# Patient Record
Sex: Male | Born: 1950
Health system: Southern US, Community
[De-identification: ages and names within clinical notes are randomized; demographics above are authoritative.]

## PROBLEM LIST (undated history)

## (undated) ENCOUNTER — Emergency Department (HOSPITAL_BASED_OUTPATIENT_CLINIC_OR_DEPARTMENT_OTHER): Payer: Medicare HMO

## (undated) DIAGNOSIS — I10 Essential (primary) hypertension: Secondary | ICD-10-CM

## (undated) DIAGNOSIS — F419 Anxiety disorder, unspecified: Secondary | ICD-10-CM

## (undated) HISTORY — PX: FOOT SURGERY: SHX648

---

## 1999-02-26 ENCOUNTER — Other Ambulatory Visit: Admission: RE | Admit: 1999-02-26 | Discharge: 1999-02-26 | Payer: Self-pay | Admitting: Podiatry

## 1999-10-08 ENCOUNTER — Encounter: Payer: Self-pay | Admitting: Gastroenterology

## 1999-10-08 ENCOUNTER — Ambulatory Visit (HOSPITAL_COMMUNITY): Admission: RE | Admit: 1999-10-08 | Discharge: 1999-10-08 | Payer: Self-pay | Admitting: Gastroenterology

## 2001-09-14 ENCOUNTER — Encounter: Payer: Self-pay | Admitting: Family Medicine

## 2001-09-14 ENCOUNTER — Encounter: Admission: RE | Admit: 2001-09-14 | Discharge: 2001-09-14 | Payer: Self-pay | Admitting: Family Medicine

## 2007-02-10 ENCOUNTER — Encounter: Admission: RE | Admit: 2007-02-10 | Discharge: 2007-02-10 | Payer: Self-pay | Admitting: Family Medicine

## 2008-01-03 ENCOUNTER — Encounter: Admission: RE | Admit: 2008-01-03 | Discharge: 2008-01-03 | Payer: Self-pay | Admitting: Family Medicine

## 2008-01-05 ENCOUNTER — Encounter: Admission: RE | Admit: 2008-01-05 | Discharge: 2008-01-05 | Payer: Self-pay | Admitting: Family Medicine

## 2008-12-06 IMAGING — CR DG THORACIC SPINE 3V
3 series · 3 of 3 positions shown · non-contrast
Comparison: 01/03/2008

CLINICAL DATA: Opacity seen over thoracic spine on prior chest x-
ray.

THORACIC SPINE - 2 VIEW + SWIMMERS

[t t-spine a.p.]
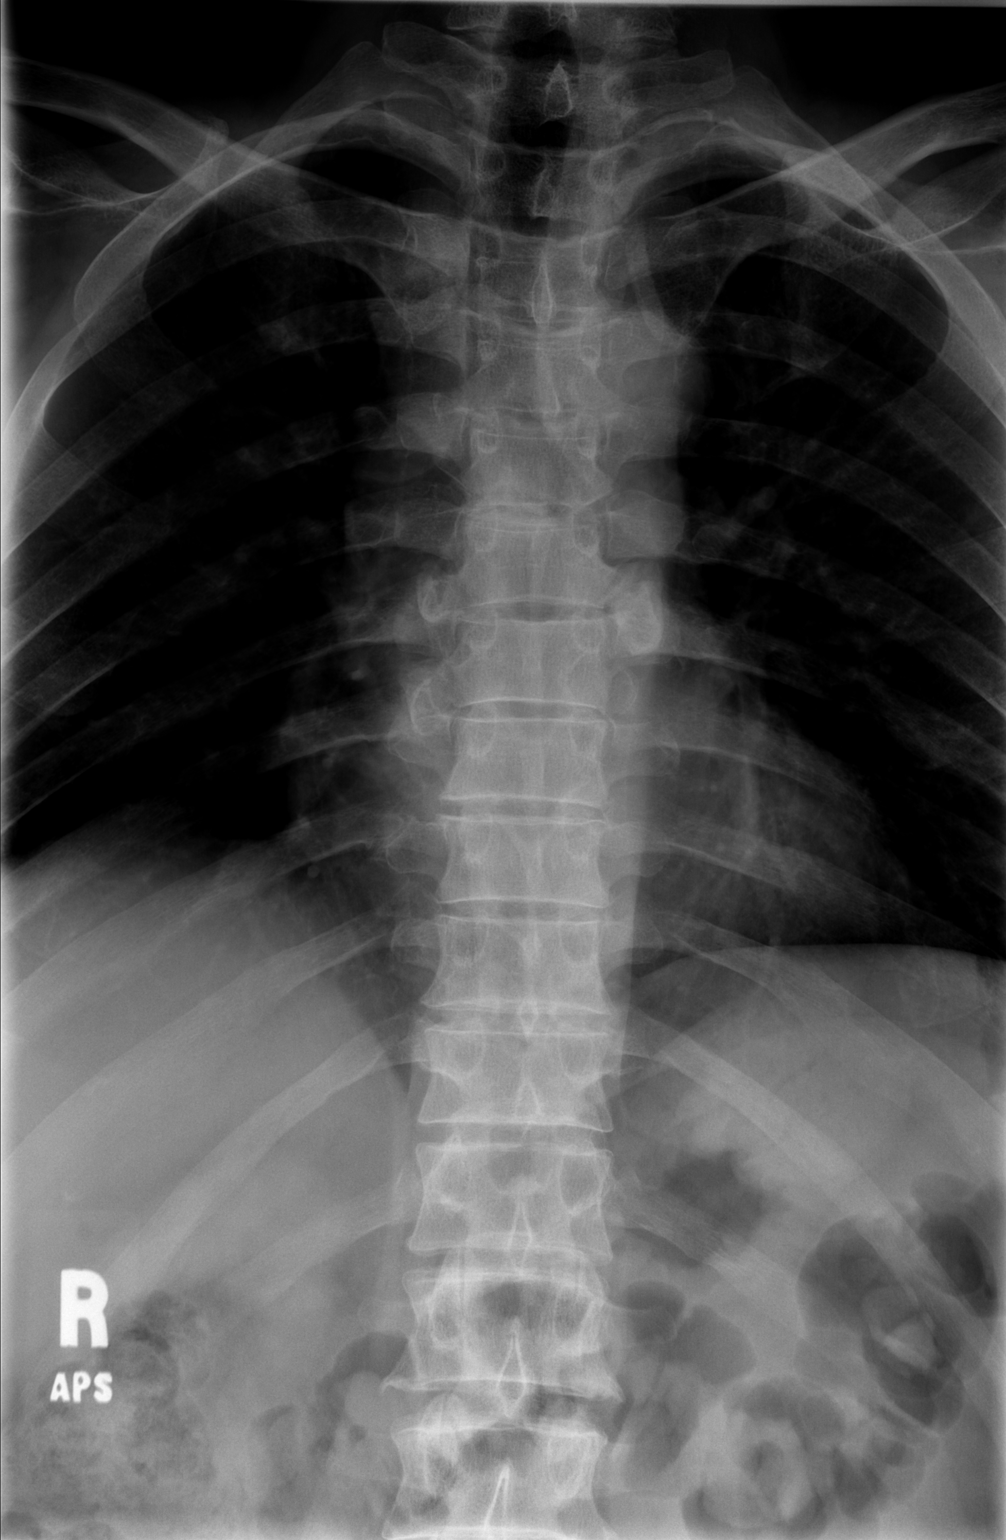

[t t-spine lat *]
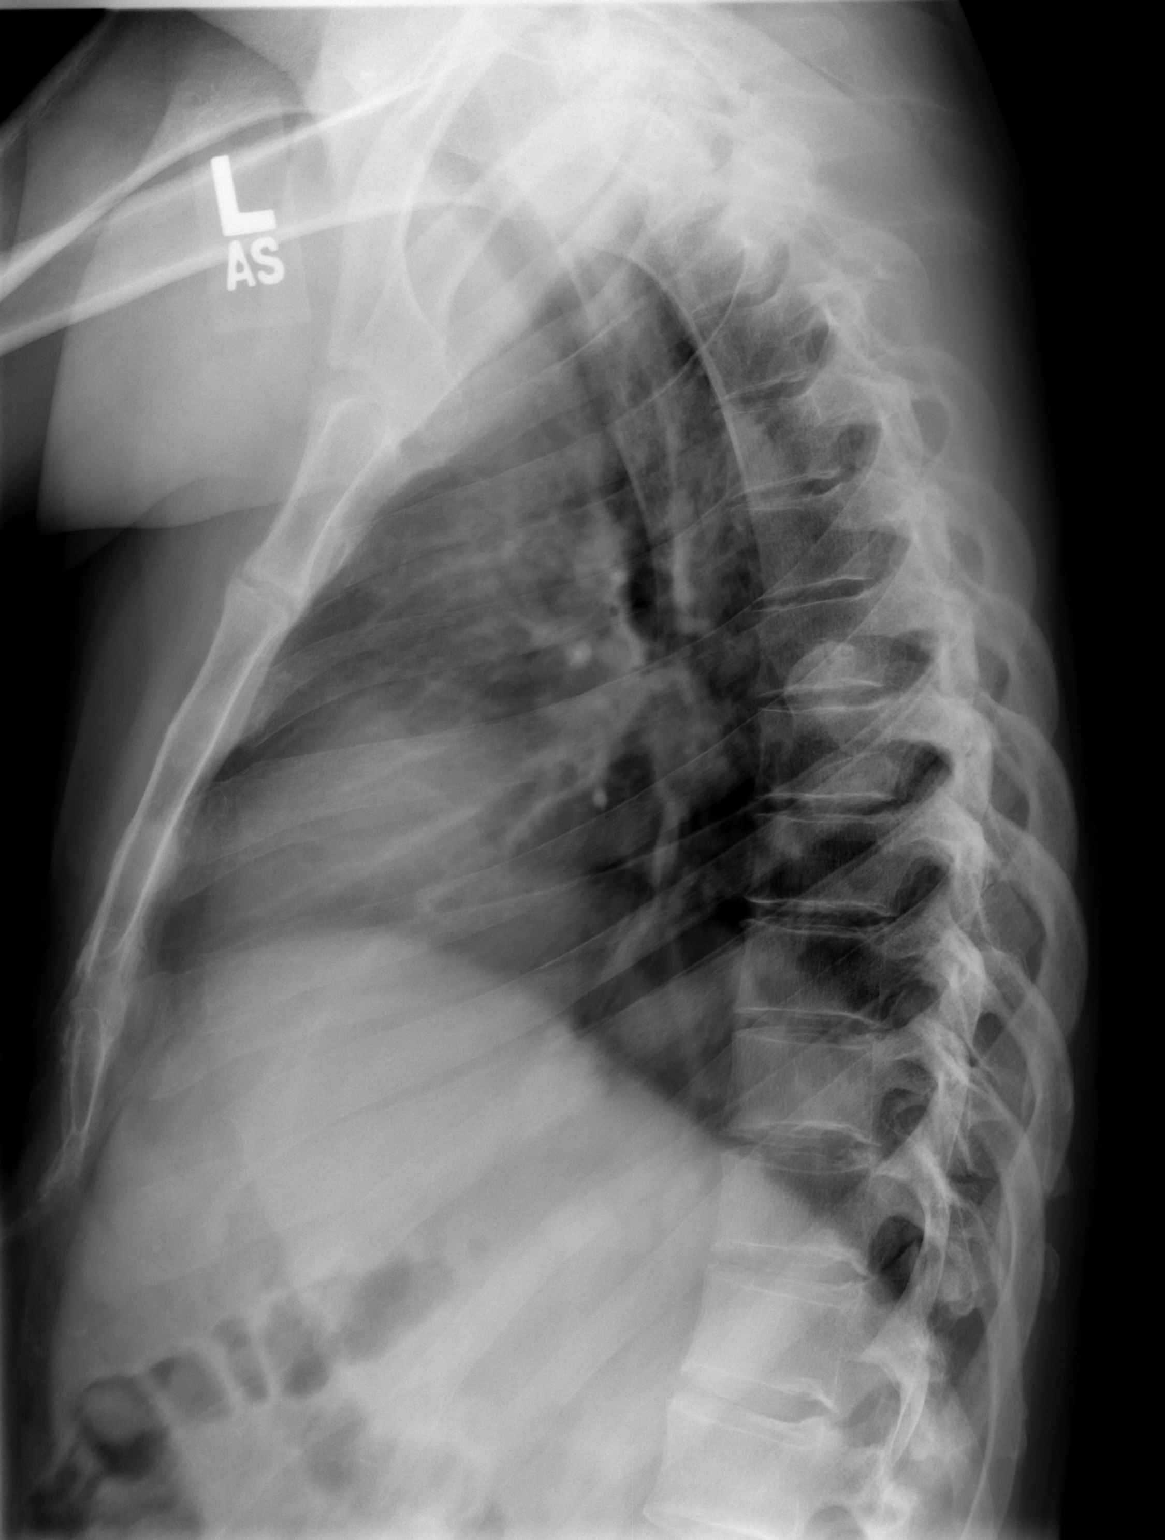

[t swimmers]
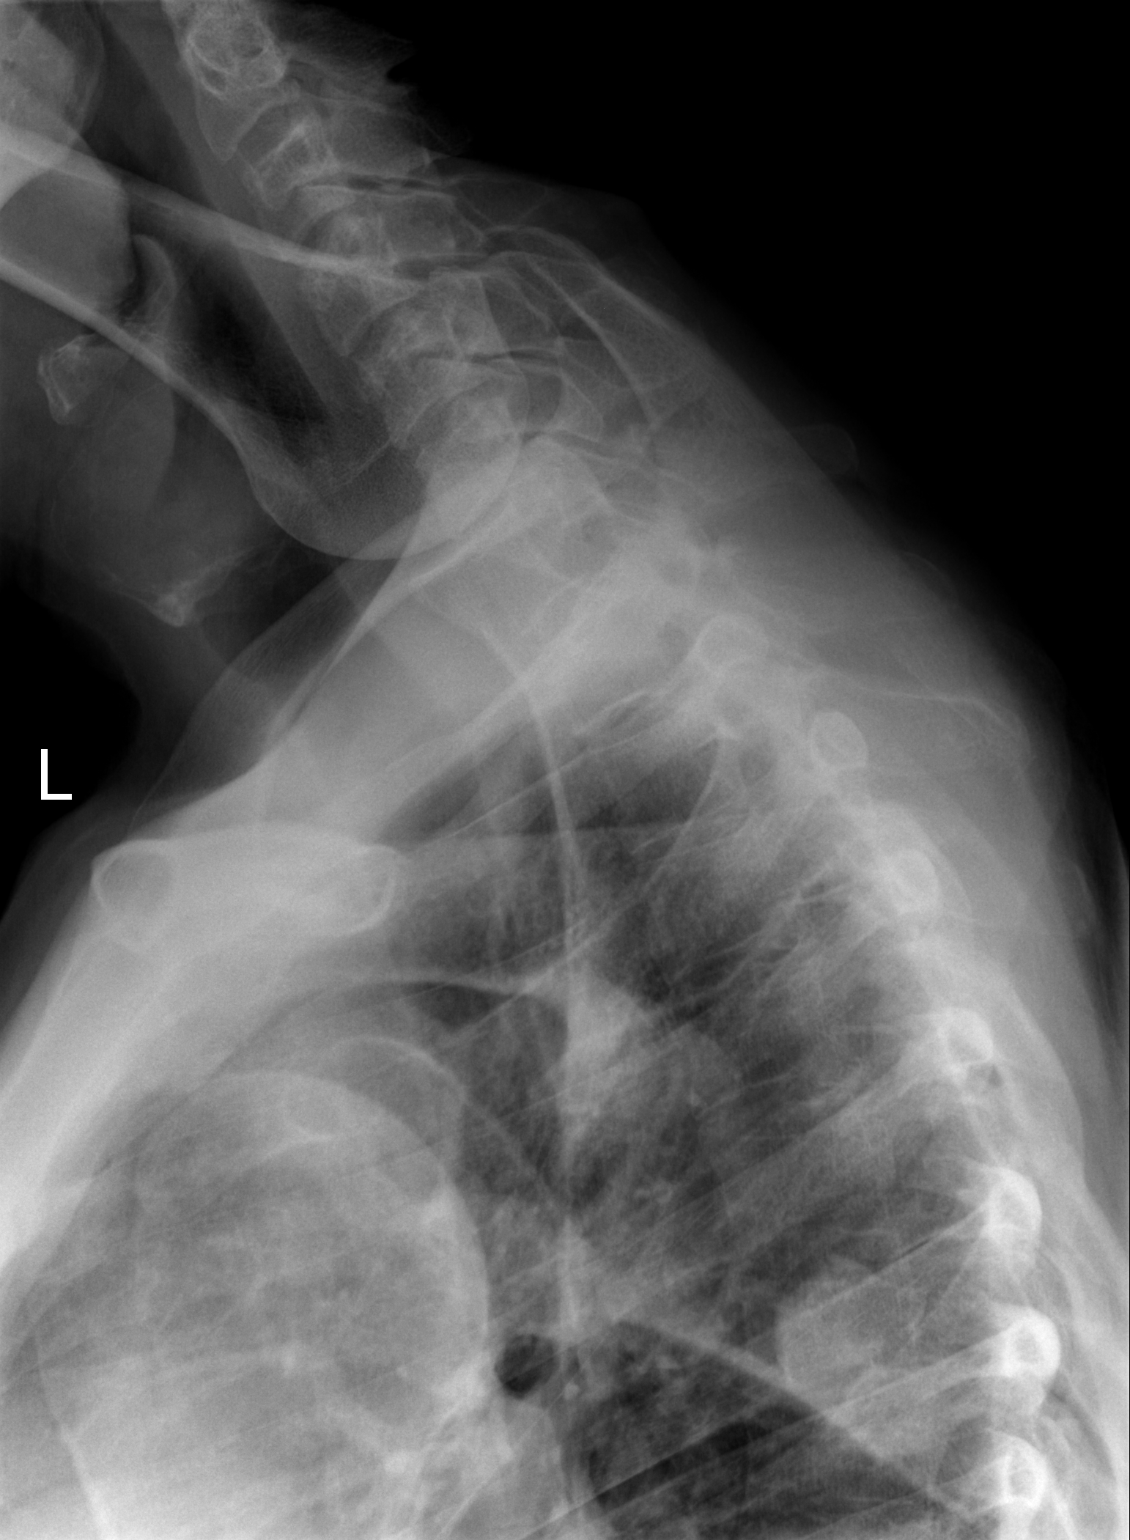

[3 of 3 positions shown; findings below may reference images not displayed]

FINDINGS: Again noted is the density projecting over the T6-7 level
on the lateral view.  On the frontal view, this appears to be
related to hypertrophic spur on the left side at the T6-7 level.
There is normal alignment.  No acute bony abnormality.
IMPRESSION: Density seen on chest x-ray appears to represent a spur on the left
side at the T6-7 level.

## 2012-06-25 DIAGNOSIS — R079 Chest pain, unspecified: Secondary | ICD-10-CM | POA: Insufficient documentation

## 2016-01-24 DIAGNOSIS — Z23 Encounter for immunization: Secondary | ICD-10-CM | POA: Diagnosis not present

## 2016-01-24 DIAGNOSIS — Z125 Encounter for screening for malignant neoplasm of prostate: Secondary | ICD-10-CM | POA: Diagnosis not present

## 2016-01-24 DIAGNOSIS — E7800 Pure hypercholesterolemia, unspecified: Secondary | ICD-10-CM | POA: Diagnosis not present

## 2016-01-24 DIAGNOSIS — R7303 Prediabetes: Secondary | ICD-10-CM | POA: Diagnosis not present

## 2016-01-24 DIAGNOSIS — M19071 Primary osteoarthritis, right ankle and foot: Secondary | ICD-10-CM | POA: Diagnosis not present

## 2016-01-24 DIAGNOSIS — I10 Essential (primary) hypertension: Secondary | ICD-10-CM | POA: Diagnosis not present

## 2016-01-24 DIAGNOSIS — K219 Gastro-esophageal reflux disease without esophagitis: Secondary | ICD-10-CM | POA: Diagnosis not present

## 2016-01-24 DIAGNOSIS — R7309 Other abnormal glucose: Secondary | ICD-10-CM | POA: Diagnosis not present

## 2016-01-24 DIAGNOSIS — Z0000 Encounter for general adult medical examination without abnormal findings: Secondary | ICD-10-CM | POA: Diagnosis not present

## 2016-03-18 DIAGNOSIS — H40013 Open angle with borderline findings, low risk, bilateral: Secondary | ICD-10-CM | POA: Diagnosis not present

## 2016-08-12 DIAGNOSIS — I10 Essential (primary) hypertension: Secondary | ICD-10-CM | POA: Diagnosis not present

## 2016-08-12 DIAGNOSIS — E7800 Pure hypercholesterolemia, unspecified: Secondary | ICD-10-CM | POA: Diagnosis not present

## 2016-08-12 DIAGNOSIS — K219 Gastro-esophageal reflux disease without esophagitis: Secondary | ICD-10-CM | POA: Diagnosis not present

## 2016-08-12 DIAGNOSIS — M19071 Primary osteoarthritis, right ankle and foot: Secondary | ICD-10-CM | POA: Diagnosis not present

## 2017-02-12 DIAGNOSIS — Z0000 Encounter for general adult medical examination without abnormal findings: Secondary | ICD-10-CM | POA: Diagnosis not present

## 2017-02-12 DIAGNOSIS — Z125 Encounter for screening for malignant neoplasm of prostate: Secondary | ICD-10-CM | POA: Diagnosis not present

## 2017-02-12 DIAGNOSIS — R7303 Prediabetes: Secondary | ICD-10-CM | POA: Diagnosis not present

## 2017-02-12 DIAGNOSIS — M19071 Primary osteoarthritis, right ankle and foot: Secondary | ICD-10-CM | POA: Diagnosis not present

## 2017-02-12 DIAGNOSIS — I10 Essential (primary) hypertension: Secondary | ICD-10-CM | POA: Diagnosis not present

## 2017-02-12 DIAGNOSIS — Z1159 Encounter for screening for other viral diseases: Secondary | ICD-10-CM | POA: Diagnosis not present

## 2017-02-12 DIAGNOSIS — E7800 Pure hypercholesterolemia, unspecified: Secondary | ICD-10-CM | POA: Diagnosis not present

## 2017-02-12 DIAGNOSIS — K219 Gastro-esophageal reflux disease without esophagitis: Secondary | ICD-10-CM | POA: Diagnosis not present

## 2017-02-12 DIAGNOSIS — Z23 Encounter for immunization: Secondary | ICD-10-CM | POA: Diagnosis not present

## 2017-03-16 DIAGNOSIS — H40013 Open angle with borderline findings, low risk, bilateral: Secondary | ICD-10-CM | POA: Diagnosis not present

## 2017-08-17 DIAGNOSIS — K219 Gastro-esophageal reflux disease without esophagitis: Secondary | ICD-10-CM | POA: Diagnosis not present

## 2017-08-17 DIAGNOSIS — E7800 Pure hypercholesterolemia, unspecified: Secondary | ICD-10-CM | POA: Diagnosis not present

## 2017-08-17 DIAGNOSIS — I10 Essential (primary) hypertension: Secondary | ICD-10-CM | POA: Diagnosis not present

## 2017-08-17 DIAGNOSIS — M19071 Primary osteoarthritis, right ankle and foot: Secondary | ICD-10-CM | POA: Diagnosis not present

## 2017-10-04 DIAGNOSIS — T34832A Frostbite with tissue necrosis of left toe(s), initial encounter: Secondary | ICD-10-CM | POA: Diagnosis not present

## 2018-03-14 DIAGNOSIS — H2513 Age-related nuclear cataract, bilateral: Secondary | ICD-10-CM | POA: Diagnosis not present

## 2018-03-18 DIAGNOSIS — M720 Palmar fascial fibromatosis [Dupuytren]: Secondary | ICD-10-CM | POA: Diagnosis not present

## 2018-03-18 DIAGNOSIS — L720 Epidermal cyst: Secondary | ICD-10-CM | POA: Diagnosis not present

## 2018-03-18 DIAGNOSIS — K219 Gastro-esophageal reflux disease without esophagitis: Secondary | ICD-10-CM | POA: Diagnosis not present

## 2018-03-18 DIAGNOSIS — Z125 Encounter for screening for malignant neoplasm of prostate: Secondary | ICD-10-CM | POA: Diagnosis not present

## 2018-03-18 DIAGNOSIS — E7800 Pure hypercholesterolemia, unspecified: Secondary | ICD-10-CM | POA: Diagnosis not present

## 2018-03-18 DIAGNOSIS — R7303 Prediabetes: Secondary | ICD-10-CM | POA: Diagnosis not present

## 2018-03-18 DIAGNOSIS — M19071 Primary osteoarthritis, right ankle and foot: Secondary | ICD-10-CM | POA: Diagnosis not present

## 2018-03-18 DIAGNOSIS — Z0000 Encounter for general adult medical examination without abnormal findings: Secondary | ICD-10-CM | POA: Diagnosis not present

## 2018-03-18 DIAGNOSIS — I10 Essential (primary) hypertension: Secondary | ICD-10-CM | POA: Diagnosis not present

## 2018-03-25 ENCOUNTER — Other Ambulatory Visit: Payer: Self-pay | Admitting: Orthopedic Surgery

## 2018-03-25 DIAGNOSIS — R229 Localized swelling, mass and lump, unspecified: Secondary | ICD-10-CM | POA: Diagnosis not present

## 2018-03-25 DIAGNOSIS — M19041 Primary osteoarthritis, right hand: Secondary | ICD-10-CM | POA: Diagnosis not present

## 2018-03-25 DIAGNOSIS — R2231 Localized swelling, mass and lump, right upper limb: Secondary | ICD-10-CM | POA: Diagnosis not present

## 2018-04-04 DIAGNOSIS — N451 Epididymitis: Secondary | ICD-10-CM | POA: Diagnosis not present

## 2018-04-08 DIAGNOSIS — L0293 Carbuncle, unspecified: Secondary | ICD-10-CM | POA: Diagnosis not present

## 2018-05-03 ENCOUNTER — Encounter (HOSPITAL_BASED_OUTPATIENT_CLINIC_OR_DEPARTMENT_OTHER): Payer: Self-pay

## 2018-05-03 ENCOUNTER — Ambulatory Visit (HOSPITAL_BASED_OUTPATIENT_CLINIC_OR_DEPARTMENT_OTHER): Admit: 2018-05-03 | Payer: Self-pay | Admitting: Orthopedic Surgery

## 2018-05-03 SURGERY — EXCISION MASS
Anesthesia: Regional | Laterality: Right

## 2018-07-29 ENCOUNTER — Other Ambulatory Visit: Payer: Self-pay | Admitting: Orthopedic Surgery

## 2018-08-18 ENCOUNTER — Encounter (HOSPITAL_BASED_OUTPATIENT_CLINIC_OR_DEPARTMENT_OTHER): Payer: Self-pay | Admitting: *Deleted

## 2018-08-18 ENCOUNTER — Other Ambulatory Visit: Payer: Self-pay

## 2018-08-18 ENCOUNTER — Encounter (HOSPITAL_BASED_OUTPATIENT_CLINIC_OR_DEPARTMENT_OTHER)
Admission: RE | Admit: 2018-08-18 | Discharge: 2018-08-18 | Disposition: A | Discharge status: Home / Self Care | Payer: Medicare HMO | Source: Ambulatory Visit | Attending: Orthopedic Surgery | Admitting: Orthopedic Surgery

## 2018-08-18 DIAGNOSIS — Z01818 Encounter for other preprocedural examination: Secondary | ICD-10-CM | POA: Diagnosis not present

## 2018-08-18 DIAGNOSIS — I10 Essential (primary) hypertension: Secondary | ICD-10-CM | POA: Insufficient documentation

## 2018-08-18 LAB — BASIC METABOLIC PANEL
Anion gap: 8 (ref 5–15)
BUN: 11 mg/dL (ref 8–23)
CHLORIDE: 102 mmol/L (ref 98–111)
CO2: 28 mmol/L (ref 22–32)
Calcium: 9.5 mg/dL (ref 8.9–10.3)
Creatinine, Ser: 1.04 mg/dL (ref 0.61–1.24)
GFR calc Af Amer: >60 mL/min (ref >60)
GFR calc non Af Amer: >60 mL/min (ref >60)
Glucose, Bld: 102 mg/dL — ABNORMAL HIGH (ref 70–99)
POTASSIUM: 3.8 mmol/L (ref 3.5–5.1)
SODIUM: 138 mmol/L (ref 135–145)

## 2018-08-23 ENCOUNTER — Encounter (HOSPITAL_BASED_OUTPATIENT_CLINIC_OR_DEPARTMENT_OTHER): Payer: Self-pay

## 2018-08-23 ENCOUNTER — Ambulatory Visit (HOSPITAL_BASED_OUTPATIENT_CLINIC_OR_DEPARTMENT_OTHER): Payer: Medicare HMO | Admitting: Anesthesiology

## 2018-08-23 ENCOUNTER — Encounter (HOSPITAL_BASED_OUTPATIENT_CLINIC_OR_DEPARTMENT_OTHER)
Admission: RE | Disposition: A | Discharge status: Home / Self Care | Payer: Self-pay | Source: Ambulatory Visit | Attending: Orthopedic Surgery

## 2018-08-23 ENCOUNTER — Ambulatory Visit (HOSPITAL_BASED_OUTPATIENT_CLINIC_OR_DEPARTMENT_OTHER)
Admission: RE | Admit: 2018-08-23 | Discharge: 2018-08-23 | Disposition: A | Discharge status: Home / Self Care | Payer: Medicare HMO | Source: Ambulatory Visit | Attending: Orthopedic Surgery | Admitting: Orthopedic Surgery

## 2018-08-23 ENCOUNTER — Other Ambulatory Visit: Payer: Self-pay

## 2018-08-23 DIAGNOSIS — M67441 Ganglion, right hand: Secondary | ICD-10-CM | POA: Diagnosis not present

## 2018-08-23 DIAGNOSIS — I10 Essential (primary) hypertension: Secondary | ICD-10-CM | POA: Diagnosis not present

## 2018-08-23 DIAGNOSIS — M19041 Primary osteoarthritis, right hand: Secondary | ICD-10-CM | POA: Diagnosis not present

## 2018-08-23 DIAGNOSIS — Z791 Long term (current) use of non-steroidal anti-inflammatories (NSAID): Secondary | ICD-10-CM | POA: Insufficient documentation

## 2018-08-23 DIAGNOSIS — Z79899 Other long term (current) drug therapy: Secondary | ICD-10-CM | POA: Diagnosis not present

## 2018-08-23 DIAGNOSIS — R2231 Localized swelling, mass and lump, right upper limb: Secondary | ICD-10-CM | POA: Diagnosis present

## 2018-08-23 HISTORY — PX: MASS EXCISION: SHX2000

## 2018-08-23 HISTORY — DX: Anxiety disorder, unspecified: F41.9

## 2018-08-23 HISTORY — DX: Essential (primary) hypertension: I10

## 2018-08-23 SURGERY — EXCISION MASS
Anesthesia: Regional | Site: Finger | Laterality: Right

## 2018-08-23 MED ORDER — MEPERIDINE HCL 25 MG/ML IJ SOLN: 6.2500 mg | INTRAMUSCULAR | Status: DC | PRN

## 2018-08-23 MED ORDER — LIDOCAINE HCL (PF) 0.5 % IJ SOLN
INTRAMUSCULAR | Status: DC | PRN
  Administered 2018-08-23: 30 mL via INTRAVENOUS

## 2018-08-23 MED ORDER — BUPIVACAINE HCL (PF) 0.5 % IJ SOLN
INTRAMUSCULAR | Status: DC | PRN
  Administered 2018-08-23: 10 mL

## 2018-08-23 MED ORDER — LIDOCAINE 2% (20 MG/ML) 5 ML SYRINGE
INTRAMUSCULAR | Status: AC
  Filled 2018-08-23: qty 5

## 2018-08-23 MED ORDER — HYDROCODONE-ACETAMINOPHEN 5-325 MG PO TABS: 1.0000 | ORAL_TABLET | Freq: Four times a day (QID) | ORAL | 0 refills | Status: AC | PRN

## 2018-08-23 MED ORDER — FENTANYL CITRATE (PF) 100 MCG/2ML IJ SOLN
INTRAMUSCULAR | Status: AC
  Filled 2018-08-23: qty 2

## 2018-08-23 MED ORDER — PROPOFOL 500 MG/50ML IV EMUL
INTRAVENOUS | Status: DC | PRN
  Administered 2018-08-23: 75 ug/kg/min via INTRAVENOUS

## 2018-08-23 MED ORDER — FENTANYL CITRATE (PF) 100 MCG/2ML IJ SOLN: 50.0000 ug | INTRAMUSCULAR | Status: DC | PRN

## 2018-08-23 MED ORDER — MIDAZOLAM HCL 2 MG/2ML IJ SOLN: 1.0000 mg | INTRAMUSCULAR | Status: DC | PRN

## 2018-08-23 MED ORDER — MIDAZOLAM HCL 2 MG/2ML IJ SOLN
INTRAMUSCULAR | Status: DC | PRN
  Administered 2018-08-23: 2 mg via INTRAVENOUS

## 2018-08-23 MED ORDER — OXYCODONE HCL 5 MG/5ML PO SOLN: 5.0000 mg | Freq: Once | ORAL | Status: DC | PRN

## 2018-08-23 MED ORDER — FENTANYL CITRATE (PF) 100 MCG/2ML IJ SOLN: 25.0000 ug | INTRAMUSCULAR | Status: DC | PRN

## 2018-08-23 MED ORDER — ONDANSETRON HCL 4 MG/2ML IJ SOLN
INTRAMUSCULAR | Status: AC
  Filled 2018-08-23: qty 2

## 2018-08-23 MED ORDER — OXYCODONE HCL 5 MG PO TABS: 5.0000 mg | ORAL_TABLET | Freq: Once | ORAL | Status: DC | PRN

## 2018-08-23 MED ORDER — CHLORHEXIDINE GLUCONATE 4 % EX LIQD: 60.0000 mL | Freq: Once | CUTANEOUS | Status: DC

## 2018-08-23 MED ORDER — CEFAZOLIN SODIUM-DEXTROSE 2-4 GM/100ML-% IV SOLN
INTRAVENOUS | Status: AC
  Filled 2018-08-23: qty 100

## 2018-08-23 MED ORDER — LACTATED RINGERS IV SOLN
INTRAVENOUS | Status: DC
  Administered 2018-08-23: 10:00:00 via INTRAVENOUS

## 2018-08-23 MED ORDER — SCOPOLAMINE 1 MG/3DAYS TD PT72: 1.0000 | MEDICATED_PATCH | Freq: Once | TRANSDERMAL | Status: DC | PRN

## 2018-08-23 MED ORDER — FENTANYL CITRATE (PF) 100 MCG/2ML IJ SOLN
INTRAMUSCULAR | Status: DC | PRN
  Administered 2018-08-23 (×2): 50 ug via INTRAVENOUS

## 2018-08-23 MED ORDER — MIDAZOLAM HCL 2 MG/2ML IJ SOLN
INTRAMUSCULAR | Status: AC
  Filled 2018-08-23: qty 2

## 2018-08-23 MED ORDER — LIDOCAINE HCL (CARDIAC) PF 100 MG/5ML IV SOSY
PREFILLED_SYRINGE | INTRAVENOUS | Status: DC | PRN
  Administered 2018-08-23: 40 mg via INTRAVENOUS

## 2018-08-23 MED ORDER — CEFAZOLIN SODIUM-DEXTROSE 2-4 GM/100ML-% IV SOLN
2.0000 g | INTRAVENOUS | Status: AC
  Administered 2018-08-23: 2 g via INTRAVENOUS

## 2018-08-23 SURGICAL SUPPLY — 48 items
BANDAGE COBAN STERILE 2 (GAUZE/BANDAGES/DRESSINGS) IMPLANT
BLADE SURG 15 STRL LF DISP TIS (BLADE) ×1 IMPLANT
BLADE SURG 15 STRL SS (BLADE) ×2
BNDG COHESIVE 1X5 TAN STRL LF (GAUZE/BANDAGES/DRESSINGS) ×3 IMPLANT
BNDG COHESIVE 3X5 TAN STRL LF (GAUZE/BANDAGES/DRESSINGS) IMPLANT
BNDG ESMARK 4X9 LF (GAUZE/BANDAGES/DRESSINGS) IMPLANT
BNDG GAUZE ELAST 4 BULKY (GAUZE/BANDAGES/DRESSINGS) IMPLANT
CHLORAPREP W/TINT 26ML (MISCELLANEOUS) ×3 IMPLANT
CORD BIPOLAR FORCEPS 12FT (ELECTRODE) ×3 IMPLANT
COVER BACK TABLE 60X90IN (DRAPES) ×3 IMPLANT
COVER MAYO STAND STRL (DRAPES) ×3 IMPLANT
COVER WAND RF STERILE (DRAPES) IMPLANT
CUFF TOURNIQUET SINGLE 18IN (TOURNIQUET CUFF) ×3 IMPLANT
DECANTER SPIKE VIAL GLASS SM (MISCELLANEOUS) IMPLANT
DRAIN PENROSE 1/2X12 LTX STRL (WOUND CARE) IMPLANT
DRAPE EXTREMITY T 121X128X90 (DRAPE) ×3 IMPLANT
DRAPE SURG 17X23 STRL (DRAPES) ×3 IMPLANT
GAUZE SPONGE 4X4 12PLY STRL (GAUZE/BANDAGES/DRESSINGS) ×3 IMPLANT
GAUZE XEROFORM 1X8 LF (GAUZE/BANDAGES/DRESSINGS) ×3 IMPLANT
GLOVE BIO SURGEON STRL SZ 6.5 (GLOVE) ×2 IMPLANT
GLOVE BIO SURGEONS STRL SZ 6.5 (GLOVE) ×1
GLOVE BIOGEL PI IND STRL 7.0 (GLOVE) ×1 IMPLANT
GLOVE BIOGEL PI IND STRL 8.5 (GLOVE) ×1 IMPLANT
GLOVE BIOGEL PI INDICATOR 7.0 (GLOVE) ×2
GLOVE BIOGEL PI INDICATOR 8.5 (GLOVE) ×2
GLOVE ECLIPSE 6.5 STRL STRAW (GLOVE) ×3 IMPLANT
GLOVE SURG ORTHO 8.0 STRL STRW (GLOVE) ×3 IMPLANT
GOWN STRL REUS W/ TWL LRG LVL3 (GOWN DISPOSABLE) ×1 IMPLANT
GOWN STRL REUS W/TWL LRG LVL3 (GOWN DISPOSABLE) ×2
GOWN STRL REUS W/TWL XL LVL3 (GOWN DISPOSABLE) ×3 IMPLANT
NDL SAFETY ECLIPSE 18X1.5 (NEEDLE) IMPLANT
NEEDLE HYPO 18GX1.5 SHARP (NEEDLE)
NEEDLE PRECISIONGLIDE 27X1.5 (NEEDLE) ×3 IMPLANT
NS IRRIG 1000ML POUR BTL (IV SOLUTION) ×3 IMPLANT
PACK BASIN DAY SURGERY FS (CUSTOM PROCEDURE TRAY) ×3 IMPLANT
PAD CAST 3X4 CTTN HI CHSV (CAST SUPPLIES) IMPLANT
PADDING CAST COTTON 3X4 STRL (CAST SUPPLIES)
SPLINT FINGER 3.25 911903 (SOFTGOODS) ×3 IMPLANT
SPLINT PLASTER CAST XFAST 3X15 (CAST SUPPLIES) IMPLANT
SPLINT PLASTER XTRA FASTSET 3X (CAST SUPPLIES)
STOCKINETTE 4X48 STRL (DRAPES) ×3 IMPLANT
SUT ETHILON 4 0 PS 2 18 (SUTURE) ×3 IMPLANT
SUT MERSILENE 5 0 P 3 (SUTURE) ×3 IMPLANT
SUT VIC AB 4-0 P2 18 (SUTURE) IMPLANT
SYR BULB 3OZ (MISCELLANEOUS) ×3 IMPLANT
SYR CONTROL 10ML LL (SYRINGE) ×3 IMPLANT
TOWEL GREEN STERILE FF (TOWEL DISPOSABLE) ×3 IMPLANT
UNDERPAD 30X30 (UNDERPADS AND DIAPERS) ×3 IMPLANT

## 2018-08-23 NOTE — Op Note (Signed)
NAME: Benjamin GroomDonald W Mcphail MEDICAL RECORD NO: [id number] DATE OF BIRTH: 09/14/1951 [facility]ATION: [facility] PHYSICIAN: Nicki ReaperGARY R. Katlin Bortner, MD   OPERATIVE REPORT   DATE OF PROCEDURE: 08/23/18    PREOPERATIVE DIAGNOSIS:   Ganglion cyst proximal interphalangeal joint right middle finger   POSTOPERATIVE DIAGNOSIS:   Same   PROCEDURE:   Excision cyst with synovectomy PIP joint right middle finger   SURGEON: Cindee SaltGary Ameya Vowell, M.D.   ASSISTANT: none   ANESTHESIA:  Bier block with sedation and Local   INTRAVENOUS FLUIDS:  Per anesthesia flow sheet.   ESTIMATED BLOOD LOSS:  Minimal.   COMPLICATIONS:  None.   SPECIMENS:   Cyst and synovium   TOURNIQUET TIME:    Total Tourniquet Time Documented: Forearm (Right) - 28 minutes Total: Forearm (Right) - 28 minutes    DISPOSITION:  Stable to PACU.   INDICATIONS: Patient is a 67 year old male with a large mass over the PIP joint of his right middle finger.  He is desirous having this removed.  He shows minimal degenerative changes of the proximal interphalangeal joint.  Pre-peri-and postoperative course been discussed along with risks and complications.  He is aware that there is no guarantee to the surgery the possibility of infection recurrence injury to arteries nerves tendons complete relief symptoms dystrophy.  Possibility of recurrence of the mass stiffness of the joint.  OPERATIVE COURSE: She is brought to the operating room where a forearm-based IV regional anesthetic was carried out without difficulty.  He was prepped and draped in supine position with the right arm free.  Prep was done with ChloraPrep.  A three-minute dry time was allowed and a timeout taken to confirm patient procedure.  A metacarpal block was given with quarter half percent bupivacaine without epinephrine approximately 8 cc was used.  A curvilinear incision was made over the proximal interphalangeal joint swollen middle phalanges.  This carried down  through subcutaneous tissue.  A large cyst was immediately encountered.  Blunt sharp dissection this was dissected free.  A Wholey extensor tendon was identified in doing so.  This was enlarged with sharp dissection.  This allowed entrance into the joint just to the ulnar side of the central slip.  A synovectomy was then performed with a hemostatic rondure of the dorsal component of the PIP joint.  Specimen was sent to pathology.  The joint was copiously irrigated with saline.  No distinct distinct osteophytes were noted.  After irrigation the extensor tendon fluid was repaired with a running 5-0 Mersilene suture.  The skin was closed interrupted 4-0 nylon sutures.  A sterile compressive dressing splint to the finger was applied.  Deflation of the tourniquet all fingers immediately pink.  He was taken to the recovery room for observation in satisfactory condition.  He will be discharged home to [facility] in 1 week on Tylenol ibuprofen with Norco for breakthrough.  Cindee SaltGary Marshaun Lortie, MD Electronically signed, 08/23/18

## 2018-08-23 NOTE — Discharge Instructions (Addendum)

## 2018-08-23 NOTE — Anesthesia Preprocedure Evaluation (Signed)
Anesthesia Evaluation  Patient identified by MRN, date of birth, ID band Patient awake    Reviewed: Allergy & Precautions, NPO status , Patient's Chart, lab work & pertinent test results, reviewed documented beta blocker date and time   Airway Mallampati: II  TM Distance: >3 FB Neck ROM: Full    Dental  (+) Dental Advisory Given   Pulmonary neg pulmonary ROS,    Pulmonary exam normal breath sounds clear to auscultation       Cardiovascular hypertension, Pt. on home beta blockers and Pt. on medications Normal cardiovascular exam Rhythm:Regular Rate:Normal     Neuro/Psych PSYCHIATRIC DISORDERS Anxiety negative neurological ROS     GI/Hepatic negative GI ROS, Neg liver ROS,   Endo/Other  negative endocrine ROS  Renal/GU negative Renal ROS     Musculoskeletal negative musculoskeletal ROS (+)   Abdominal   Peds  Hematology negative hematology ROS (+)   Anesthesia Other Findings   Reproductive/Obstetrics negative OB ROS                             Anesthesia Physical Anesthesia Plan  ASA: II  Anesthesia Plan: Bier Block and Bier Block-LIDOCAINE ONLY   Post-op Pain Management:    Induction: Intravenous  PONV Risk Score and Plan: 2 and Ondansetron, Midazolam and Propofol infusion  Airway Management Planned: Simple Face Mask  Additional Equipment:   Intra-op Plan:   Post-operative Plan:   Informed Consent: I have reviewed the patients History and Physical, chart, labs and discussed the procedure including the risks, benefits and alternatives for the proposed anesthesia with the patient or authorized representative who has indicated his/her understanding and acceptance.   Dental advisory given  Plan Discussed with: CRNA  Anesthesia Plan Comments:         Anesthesia Quick Evaluation

## 2018-08-23 NOTE — Brief Op Note (Signed)
08/23/2018  11:23 AM  PATIENT:  Benjamin Stephens  67 y.o. male  PRE-OPERATIVE DIAGNOSIS:  MASS PROXIMAL INTERPHALANGEAL JOINT RIGHT MIDDLE FINGER  POST-OPERATIVE DIAGNOSIS:  MASS PROXIMAL INTERPHALANGEAL JOINT RIGHT MIDDLE FINGER  PROCEDURE:  Procedure(s): EXCISION MASS RIGHT MIDDLE FINGER, DEBRIDEMENT PROXIMAL INTERPHALANGEAL JOINT RIGHT MIDDLE FINGER (Right)  SURGEON:  Surgeon(s) and Role:    * Cindee SaltKuzma, Aldena Worm, MD - Primary  PHYSICIAN ASSISTANT:   ASSISTANTS: none   ANESTHESIA:   local, regional and IV sedation  EBL: 1mlBLOOD ADMINISTERED:none  DRAINS: none   LOCAL MEDICATIONS USED:  BUPIVICAINE   SPECIMEN:  Excision  DISPOSITION OF SPECIMEN:  PATHOLOGY  COUNTS:  YES  TOURNIQUET:   Total Tourniquet Time Documented: Forearm (Right) - 28 minutes Total: Forearm (Right) - 28 minutes   DICTATION: .Reubin Milanragon Dictation  PLAN OF CARE: Discharge to home after PACU  PATIENT DISPOSITION:  PACU - hemodynamically stable.

## 2018-08-23 NOTE — H&P (Signed)
  Benjamin Stephens is an 67 y.o. male.   Chief Complaint: mass right middle finger HPI:Benjamin Stephens is a [age]-year-old right-hand-dominant male referred by Dr. Lupe Carneyean Mitchell for consultation regarding a mass on the proximal inner phalangeal joint of his right middle finger. This been present for 2 to 3 months. Is not causing any pain or discomfort. He recalls no history of injury. He has noted no loss of function of mobility. He has no history of diabetes thyroid problems or gout. He does have a history of slight arthritis. He has no history of diabetes thyroid problems arthritis or gout   Past Medical History:  Diagnosis Date  . Anxiety   . Hypertension     Past Surgical History:  Procedure Laterality Date  . FOOT SURGERY      History reviewed. No pertinent family history. Social History:  reports that he has never smoked. He has never used smokeless tobacco. He reports that he does not drink alcohol or use drugs.  Allergies: No Known Allergies  Medications Prior to Admission  Medication Sig Dispense Refill  . hydrochlorothiazide (HYDRODIURIL) 25 MG tablet Take 25 mg by mouth daily.    . metoprolol tartrate (LOPRESSOR) 100 MG tablet Take 100 mg by mouth 2 (two) times daily.    . multivitamin-iron-minerals-folic acid (CENTRUM) chewable tablet Chew 1 tablet by mouth daily.    . Naproxen Sodium 220 MG CAPS Take by mouth.    Marland Kitchen. omeprazole (PRILOSEC) 20 MG capsule Take 20 mg by mouth daily.    . simvastatin (ZOCOR) 40 MG tablet Take 40 mg by mouth daily.      No results found for this or any previous visit (from the past 48 hour(s)).  No results found.   Pertinent items are noted in HPI.  Height 6' (1.829 m), weight 88.5 kg.  General appearance: alert, cooperative and appears stated age Head: Normocephalic, without obvious abnormality Neck: no JVD Resp: clear to auscultation bilaterally Cardio: regular rate and rhythm, S1, S2 normal, no murmur, click, rub or gallop GI: soft,  non-tender; bowel sounds normal; no masses,  no organomegaly Extremities: mass right middle finger Pulses: 2+ and symmetric Skin: Skin color, texture, turgor normal. No rashes or lesions Neurologic: Grossly normal Incision/Wound: na  Assessment/Plan Assessment:  1. Mass  2. Osteoarthritis of finger of right hand    Plan: We have discussed the etiology that this with him Dr. Quita SkyeMitchell's notes are reviewed. He would like to have this removed. Pre-peri-and postoperative course are discussed along with risks and complications. He is aware that there is no guarantee to the surgery the possibility of infection recurrence injury to arteries nerves tendons incomplete relief symptoms and dystrophy. He is advised potential of stiffness potential of recurrence due to the arthritis beneath the area of the cyst. Questions are encouraged and answered to his satisfaction. He is scheduled for excision of mass with debridement proximal interphalangeal joint right middle finger as an outpatient under regional anesthesia.    Cindee SaltGary Natahlia Hoggard 08/23/2018, 9:37 AM

## 2018-08-23 NOTE — Transfer of Care (Signed)
Immediate Anesthesia Transfer of Care Note  Patient: Benjamin Stephens  Procedure(s) Performed: EXCISION MASS RIGHT MIDDLE FINGER, DEBRIDEMENT PROXIMAL INTERPHALANGEAL JOINT RIGHT MIDDLE FINGER (Right Finger)  Patient Location: PACU  Anesthesia Type:Bier block  Level of Consciousness: awake, alert , oriented and patient cooperative  Airway & Oxygen Therapy: Patient Spontanous Breathing and Patient connected to face mask oxygen  Post-op Assessment: Report given to RN and Post -op Vital signs reviewed and stable  Post vital signs: Reviewed and stable  Last Vitals:  Vitals Value Taken Time  BP    Temp    Pulse 65 08/23/2018 11:27 AM  Resp 12 08/23/2018 11:27 AM  SpO2 100 % 08/23/2018 11:27 AM  Vitals shown include unvalidated device data.  Last Pain:  Vitals:   08/23/18 0959  TempSrc: Oral  PainSc: 0-No pain      Patients Stated Pain Goal: 3 (08/23/18 0959)  Complications: No apparent anesthesia complications

## 2018-08-23 NOTE — Anesthesia Postprocedure Evaluation (Signed)
Anesthesia Post Note  Patient: Benjamin Stephens  Procedure(s) Performed: EXCISION MASS RIGHT MIDDLE FINGER, DEBRIDEMENT PROXIMAL INTERPHALANGEAL JOINT RIGHT MIDDLE FINGER (Right Finger)     Patient location during evaluation: PACU Anesthesia Type: Bier Block Level of consciousness: awake and alert Pain management: pain level controlled Vital Signs Assessment: post-procedure vital signs reviewed and stable Respiratory status: spontaneous breathing Cardiovascular status: stable Anesthetic complications: no    Last Vitals:  Vitals:   08/23/18 1145 08/23/18 1207  BP: (!) 143/83 (!) 149/89  Pulse: 63 (!) 56  Resp: 14 16  Temp:  36.4 C  SpO2: 96% 97%    Last Pain:  Vitals:   08/23/18 1207  TempSrc:   PainSc: 0-No pain                 Lewie LoronJohn Loree Shehata

## 2018-08-24 ENCOUNTER — Encounter (HOSPITAL_BASED_OUTPATIENT_CLINIC_OR_DEPARTMENT_OTHER): Payer: Self-pay | Admitting: Orthopedic Surgery

## 2018-09-20 DIAGNOSIS — E7800 Pure hypercholesterolemia, unspecified: Secondary | ICD-10-CM | POA: Diagnosis not present

## 2018-09-20 DIAGNOSIS — R7303 Prediabetes: Secondary | ICD-10-CM | POA: Diagnosis not present

## 2018-09-20 DIAGNOSIS — R5383 Other fatigue: Secondary | ICD-10-CM | POA: Diagnosis not present

## 2018-09-20 DIAGNOSIS — I10 Essential (primary) hypertension: Secondary | ICD-10-CM | POA: Diagnosis not present

## 2018-09-20 DIAGNOSIS — K219 Gastro-esophageal reflux disease without esophagitis: Secondary | ICD-10-CM | POA: Diagnosis not present

## 2018-09-20 DIAGNOSIS — M19071 Primary osteoarthritis, right ankle and foot: Secondary | ICD-10-CM | POA: Diagnosis not present

## 2019-03-17 DIAGNOSIS — H40013 Open angle with borderline findings, low risk, bilateral: Secondary | ICD-10-CM | POA: Diagnosis not present

## 2019-03-17 DIAGNOSIS — H52203 Unspecified astigmatism, bilateral: Secondary | ICD-10-CM | POA: Diagnosis not present

## 2019-03-17 DIAGNOSIS — H5203 Hypermetropia, bilateral: Secondary | ICD-10-CM | POA: Diagnosis not present

## 2019-04-03 DIAGNOSIS — R7303 Prediabetes: Secondary | ICD-10-CM | POA: Diagnosis not present

## 2019-04-03 DIAGNOSIS — E7800 Pure hypercholesterolemia, unspecified: Secondary | ICD-10-CM | POA: Diagnosis not present

## 2019-04-03 DIAGNOSIS — Z125 Encounter for screening for malignant neoplasm of prostate: Secondary | ICD-10-CM | POA: Diagnosis not present

## 2019-04-03 DIAGNOSIS — M19071 Primary osteoarthritis, right ankle and foot: Secondary | ICD-10-CM | POA: Diagnosis not present

## 2019-04-03 DIAGNOSIS — K219 Gastro-esophageal reflux disease without esophagitis: Secondary | ICD-10-CM | POA: Diagnosis not present

## 2019-04-03 DIAGNOSIS — Z0000 Encounter for general adult medical examination without abnormal findings: Secondary | ICD-10-CM | POA: Diagnosis not present

## 2019-04-03 DIAGNOSIS — I10 Essential (primary) hypertension: Secondary | ICD-10-CM | POA: Diagnosis not present

## 2019-04-26 DIAGNOSIS — Z20828 Contact with and (suspected) exposure to other viral communicable diseases: Secondary | ICD-10-CM | POA: Diagnosis not present

## 2019-10-03 DIAGNOSIS — K219 Gastro-esophageal reflux disease without esophagitis: Secondary | ICD-10-CM | POA: Diagnosis not present

## 2019-10-03 DIAGNOSIS — R7303 Prediabetes: Secondary | ICD-10-CM | POA: Diagnosis not present

## 2019-10-03 DIAGNOSIS — M19071 Primary osteoarthritis, right ankle and foot: Secondary | ICD-10-CM | POA: Diagnosis not present

## 2019-10-03 DIAGNOSIS — I10 Essential (primary) hypertension: Secondary | ICD-10-CM | POA: Diagnosis not present

## 2019-10-03 DIAGNOSIS — E7800 Pure hypercholesterolemia, unspecified: Secondary | ICD-10-CM | POA: Diagnosis not present

## 2019-10-25 ENCOUNTER — Ambulatory Visit: Payer: Medicare HMO | Attending: Internal Medicine

## 2019-10-25 DIAGNOSIS — Z23 Encounter for immunization: Secondary | ICD-10-CM

## 2019-10-25 NOTE — Progress Notes (Signed)
   Covid-19 Vaccination Clinic  Name:  Benjamin Stephens    MRN: [id number] DOB: Aug 01, 1951  10/25/2019  Mr. Benjamin Stephens was observed post Covid-19 immunization for 15 minutes without incidence. He was provided with Vaccine Information Sheet and instruction to access the V-Safe system.   Mr. Benjamin Stephens was instructed to call 911 with any severe reactions post vaccine: Marland Kitchen Difficulty breathing  . Swelling of your face and throat  . A fast heartbeat  . A bad rash all over your body  . Dizziness and weakness    Immunizations Administered    Name Date Dose VIS Date Route   Pfizer COVID-19 Vaccine 10/25/2019  2:12 PM 0.3 mL 09/15/2019 Intramuscular   Manufacturer: [employer], [employer]   Lot: [id number]   NDC: [address]-0

## 2019-11-15 ENCOUNTER — Ambulatory Visit: Payer: Medicare HMO | Attending: Internal Medicine

## 2019-11-15 DIAGNOSIS — Z23 Encounter for immunization: Secondary | ICD-10-CM

## 2019-11-15 NOTE — Progress Notes (Signed)
   Covid-19 Vaccination Clinic  Name:  Benjamin Stephens    MRN: [id number] DOB: 1950-12-23  11/15/2019  Benjamin Stephens was observed post Covid-19 immunization for 15 minutes without incidence. He was provided with Vaccine Information Sheet and instruction to access the V-Safe system.   Benjamin Stephens was instructed to call 911 with any severe reactions post vaccine: Marland Kitchen Difficulty breathing  . Swelling of your face and throat  . A fast heartbeat  . A bad rash all over your body  . Dizziness and weakness    Immunizations Administered    Name Date Dose VIS Date Route   Pfizer COVID-19 Vaccine 11/15/2019 12:10 PM 0.3 mL 09/15/2019 Intramuscular   Manufacturer: [employer], [employer]   Lot: [id number]   NDC: [address]-9

## 2020-03-19 DIAGNOSIS — H52203 Unspecified astigmatism, bilateral: Secondary | ICD-10-CM | POA: Diagnosis not present

## 2020-03-19 DIAGNOSIS — H40013 Open angle with borderline findings, low risk, bilateral: Secondary | ICD-10-CM | POA: Diagnosis not present

## 2020-03-19 DIAGNOSIS — H5203 Hypermetropia, bilateral: Secondary | ICD-10-CM | POA: Diagnosis not present

## 2020-04-10 DIAGNOSIS — Z0000 Encounter for general adult medical examination without abnormal findings: Secondary | ICD-10-CM | POA: Diagnosis not present

## 2020-04-10 DIAGNOSIS — Z125 Encounter for screening for malignant neoplasm of prostate: Secondary | ICD-10-CM | POA: Diagnosis not present

## 2020-04-10 DIAGNOSIS — M19071 Primary osteoarthritis, right ankle and foot: Secondary | ICD-10-CM | POA: Diagnosis not present

## 2020-04-10 DIAGNOSIS — I10 Essential (primary) hypertension: Secondary | ICD-10-CM | POA: Diagnosis not present

## 2020-04-10 DIAGNOSIS — Z23 Encounter for immunization: Secondary | ICD-10-CM | POA: Diagnosis not present

## 2020-04-10 DIAGNOSIS — E7800 Pure hypercholesterolemia, unspecified: Secondary | ICD-10-CM | POA: Diagnosis not present

## 2020-04-10 DIAGNOSIS — K219 Gastro-esophageal reflux disease without esophagitis: Secondary | ICD-10-CM | POA: Diagnosis not present

## 2020-04-10 DIAGNOSIS — R7303 Prediabetes: Secondary | ICD-10-CM | POA: Diagnosis not present

## 2020-06-18 DIAGNOSIS — Z23 Encounter for immunization: Secondary | ICD-10-CM | POA: Diagnosis not present

## 2020-10-24 DIAGNOSIS — K219 Gastro-esophageal reflux disease without esophagitis: Secondary | ICD-10-CM | POA: Diagnosis not present

## 2020-10-24 DIAGNOSIS — R7303 Prediabetes: Secondary | ICD-10-CM | POA: Diagnosis not present

## 2020-10-24 DIAGNOSIS — E7800 Pure hypercholesterolemia, unspecified: Secondary | ICD-10-CM | POA: Diagnosis not present

## 2020-10-24 DIAGNOSIS — M19071 Primary osteoarthritis, right ankle and foot: Secondary | ICD-10-CM | POA: Diagnosis not present

## 2020-10-24 DIAGNOSIS — I10 Essential (primary) hypertension: Secondary | ICD-10-CM | POA: Diagnosis not present

## 2021-03-26 DIAGNOSIS — H52203 Unspecified astigmatism, bilateral: Secondary | ICD-10-CM | POA: Diagnosis not present

## 2021-03-26 DIAGNOSIS — H40013 Open angle with borderline findings, low risk, bilateral: Secondary | ICD-10-CM | POA: Diagnosis not present

## 2021-04-25 DIAGNOSIS — Z0000 Encounter for general adult medical examination without abnormal findings: Secondary | ICD-10-CM | POA: Diagnosis not present

## 2021-04-25 DIAGNOSIS — Z125 Encounter for screening for malignant neoplasm of prostate: Secondary | ICD-10-CM | POA: Diagnosis not present

## 2021-04-25 DIAGNOSIS — R7303 Prediabetes: Secondary | ICD-10-CM | POA: Diagnosis not present

## 2021-04-25 DIAGNOSIS — M19071 Primary osteoarthritis, right ankle and foot: Secondary | ICD-10-CM | POA: Diagnosis not present

## 2021-04-25 DIAGNOSIS — K219 Gastro-esophageal reflux disease without esophagitis: Secondary | ICD-10-CM | POA: Diagnosis not present

## 2021-04-25 DIAGNOSIS — I10 Essential (primary) hypertension: Secondary | ICD-10-CM | POA: Diagnosis not present

## 2021-04-25 DIAGNOSIS — E7800 Pure hypercholesterolemia, unspecified: Secondary | ICD-10-CM | POA: Diagnosis not present

## 2021-10-28 DIAGNOSIS — I10 Essential (primary) hypertension: Secondary | ICD-10-CM | POA: Diagnosis not present

## 2021-10-28 DIAGNOSIS — E7800 Pure hypercholesterolemia, unspecified: Secondary | ICD-10-CM | POA: Diagnosis not present

## 2021-10-28 DIAGNOSIS — R7303 Prediabetes: Secondary | ICD-10-CM | POA: Diagnosis not present

## 2021-10-28 DIAGNOSIS — K219 Gastro-esophageal reflux disease without esophagitis: Secondary | ICD-10-CM | POA: Diagnosis not present

## 2021-10-28 DIAGNOSIS — M19071 Primary osteoarthritis, right ankle and foot: Secondary | ICD-10-CM | POA: Diagnosis not present

## 2022-03-30 DIAGNOSIS — H40013 Open angle with borderline findings, low risk, bilateral: Secondary | ICD-10-CM | POA: Diagnosis not present

## 2022-03-30 DIAGNOSIS — H2513 Age-related nuclear cataract, bilateral: Secondary | ICD-10-CM | POA: Diagnosis not present

## 2022-03-30 DIAGNOSIS — H5213 Myopia, bilateral: Secondary | ICD-10-CM | POA: Diagnosis not present

## 2022-05-12 DIAGNOSIS — I10 Essential (primary) hypertension: Secondary | ICD-10-CM | POA: Diagnosis not present

## 2022-05-12 DIAGNOSIS — E7800 Pure hypercholesterolemia, unspecified: Secondary | ICD-10-CM | POA: Diagnosis not present

## 2022-05-12 DIAGNOSIS — Z0000 Encounter for general adult medical examination without abnormal findings: Secondary | ICD-10-CM | POA: Diagnosis not present

## 2022-05-12 DIAGNOSIS — M19071 Primary osteoarthritis, right ankle and foot: Secondary | ICD-10-CM | POA: Diagnosis not present

## 2022-05-12 DIAGNOSIS — K219 Gastro-esophageal reflux disease without esophagitis: Secondary | ICD-10-CM | POA: Diagnosis not present

## 2022-05-12 DIAGNOSIS — R7303 Prediabetes: Secondary | ICD-10-CM | POA: Diagnosis not present

## 2022-11-20 DIAGNOSIS — E7800 Pure hypercholesterolemia, unspecified: Secondary | ICD-10-CM | POA: Diagnosis not present

## 2022-11-20 DIAGNOSIS — K219 Gastro-esophageal reflux disease without esophagitis: Secondary | ICD-10-CM | POA: Diagnosis not present

## 2022-11-20 DIAGNOSIS — I10 Essential (primary) hypertension: Secondary | ICD-10-CM | POA: Diagnosis not present

## 2022-11-20 DIAGNOSIS — R7303 Prediabetes: Secondary | ICD-10-CM | POA: Diagnosis not present

## 2022-11-20 DIAGNOSIS — M19071 Primary osteoarthritis, right ankle and foot: Secondary | ICD-10-CM | POA: Diagnosis not present

## 2023-03-31 DIAGNOSIS — H02831 Dermatochalasis of right upper eyelid: Secondary | ICD-10-CM | POA: Diagnosis not present

## 2023-03-31 DIAGNOSIS — H40013 Open angle with borderline findings, low risk, bilateral: Secondary | ICD-10-CM | POA: Diagnosis not present

## 2023-03-31 DIAGNOSIS — H52203 Unspecified astigmatism, bilateral: Secondary | ICD-10-CM | POA: Diagnosis not present

## 2023-03-31 DIAGNOSIS — H2513 Age-related nuclear cataract, bilateral: Secondary | ICD-10-CM | POA: Diagnosis not present

## 2023-03-31 DIAGNOSIS — H02834 Dermatochalasis of left upper eyelid: Secondary | ICD-10-CM | POA: Diagnosis not present

## 2023-03-31 DIAGNOSIS — H5203 Hypermetropia, bilateral: Secondary | ICD-10-CM | POA: Diagnosis not present

## 2023-06-09 DIAGNOSIS — K5730 Diverticulosis of large intestine without perforation or abscess without bleeding: Secondary | ICD-10-CM | POA: Diagnosis not present

## 2023-06-09 DIAGNOSIS — Z1211 Encounter for screening for malignant neoplasm of colon: Secondary | ICD-10-CM | POA: Diagnosis not present

## 2023-06-09 DIAGNOSIS — K649 Unspecified hemorrhoids: Secondary | ICD-10-CM | POA: Diagnosis not present

## 2023-06-09 DIAGNOSIS — K635 Polyp of colon: Secondary | ICD-10-CM | POA: Diagnosis not present

## 2023-06-11 DIAGNOSIS — K635 Polyp of colon: Secondary | ICD-10-CM | POA: Diagnosis not present

## 2023-06-23 DIAGNOSIS — K219 Gastro-esophageal reflux disease without esophagitis: Secondary | ICD-10-CM | POA: Diagnosis not present

## 2023-06-23 DIAGNOSIS — M19071 Primary osteoarthritis, right ankle and foot: Secondary | ICD-10-CM | POA: Diagnosis not present

## 2023-06-23 DIAGNOSIS — E7800 Pure hypercholesterolemia, unspecified: Secondary | ICD-10-CM | POA: Diagnosis not present

## 2023-06-23 DIAGNOSIS — R7303 Prediabetes: Secondary | ICD-10-CM | POA: Diagnosis not present

## 2023-06-23 DIAGNOSIS — Z0000 Encounter for general adult medical examination without abnormal findings: Secondary | ICD-10-CM | POA: Diagnosis not present

## 2023-06-23 DIAGNOSIS — I10 Essential (primary) hypertension: Secondary | ICD-10-CM | POA: Diagnosis not present

## 2023-12-23 DIAGNOSIS — R7303 Prediabetes: Secondary | ICD-10-CM | POA: Diagnosis not present

## 2023-12-23 DIAGNOSIS — E7800 Pure hypercholesterolemia, unspecified: Secondary | ICD-10-CM | POA: Diagnosis not present

## 2023-12-23 DIAGNOSIS — I10 Essential (primary) hypertension: Secondary | ICD-10-CM | POA: Diagnosis not present

## 2023-12-23 DIAGNOSIS — K219 Gastro-esophageal reflux disease without esophagitis: Secondary | ICD-10-CM | POA: Diagnosis not present

## 2023-12-28 DIAGNOSIS — E7800 Pure hypercholesterolemia, unspecified: Secondary | ICD-10-CM | POA: Diagnosis not present

## 2023-12-28 DIAGNOSIS — I10 Essential (primary) hypertension: Secondary | ICD-10-CM | POA: Diagnosis not present

## 2023-12-28 DIAGNOSIS — M19071 Primary osteoarthritis, right ankle and foot: Secondary | ICD-10-CM | POA: Diagnosis not present

## 2023-12-28 DIAGNOSIS — K219 Gastro-esophageal reflux disease without esophagitis: Secondary | ICD-10-CM | POA: Diagnosis not present

## 2023-12-28 DIAGNOSIS — R7303 Prediabetes: Secondary | ICD-10-CM | POA: Diagnosis not present

## 2023-12-28 DIAGNOSIS — M720 Palmar fascial fibromatosis [Dupuytren]: Secondary | ICD-10-CM | POA: Diagnosis not present
